# Patient Record
Sex: Male | Born: 1980 | Race: White | Hispanic: Yes | Smoking: Never smoker
Health system: Southern US, Community
[De-identification: ages and names within clinical notes are randomized; demographics above are authoritative.]

## PROBLEM LIST (undated history)

## (undated) HISTORY — PX: ABDOMINAL SURGERY: SHX537

---

## 2002-09-03 ENCOUNTER — Emergency Department (HOSPITAL_COMMUNITY): Admission: EM | Admit: 2002-09-03 | Discharge: 2002-09-03 | Payer: Self-pay | Admitting: Internal Medicine

## 2002-11-04 ENCOUNTER — Emergency Department (HOSPITAL_COMMUNITY): Admission: EM | Admit: 2002-11-04 | Discharge: 2002-11-04 | Payer: Self-pay | Admitting: Emergency Medicine

## 2005-10-08 ENCOUNTER — Emergency Department (HOSPITAL_COMMUNITY): Admission: EM | Admit: 2005-10-08 | Discharge: 2005-10-08 | Payer: Self-pay | Admitting: Emergency Medicine

## 2006-11-04 ENCOUNTER — Emergency Department (HOSPITAL_COMMUNITY): Admission: EM | Admit: 2006-11-04 | Discharge: 2006-11-04 | Payer: Self-pay | Admitting: Emergency Medicine

## 2008-09-24 ENCOUNTER — Emergency Department (HOSPITAL_COMMUNITY): Admission: EM | Admit: 2008-09-24 | Discharge: 2008-09-24 | Payer: Self-pay | Admitting: Emergency Medicine

## 2016-12-27 ENCOUNTER — Emergency Department (HOSPITAL_COMMUNITY)
Admission: EM | Admit: 2016-12-27 | Discharge: 2016-12-27 | Disposition: A | Discharge status: Home / Self Care | Payer: Self-pay | Attending: Emergency Medicine | Admitting: Emergency Medicine

## 2016-12-27 ENCOUNTER — Encounter (HOSPITAL_COMMUNITY): Payer: Self-pay | Admitting: Emergency Medicine

## 2016-12-27 ENCOUNTER — Emergency Department (HOSPITAL_COMMUNITY): Payer: Self-pay

## 2016-12-27 DIAGNOSIS — S0990XA Unspecified injury of head, initial encounter: Secondary | ICD-10-CM

## 2016-12-27 DIAGNOSIS — S80811A Abrasion, right lower leg, initial encounter: Secondary | ICD-10-CM | POA: Insufficient documentation

## 2016-12-27 DIAGNOSIS — Y92410 Unspecified street and highway as the place of occurrence of the external cause: Secondary | ICD-10-CM | POA: Insufficient documentation

## 2016-12-27 DIAGNOSIS — T07XXXA Unspecified multiple injuries, initial encounter: Secondary | ICD-10-CM

## 2016-12-27 DIAGNOSIS — S80812A Abrasion, left lower leg, initial encounter: Secondary | ICD-10-CM | POA: Insufficient documentation

## 2016-12-27 DIAGNOSIS — Y9389 Activity, other specified: Secondary | ICD-10-CM | POA: Insufficient documentation

## 2016-12-27 DIAGNOSIS — S0081XA Abrasion of other part of head, initial encounter: Secondary | ICD-10-CM | POA: Insufficient documentation

## 2016-12-27 DIAGNOSIS — Y999 Unspecified external cause status: Secondary | ICD-10-CM | POA: Insufficient documentation
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
## 2016-12-27 NOTE — ED Notes (Signed)
Pt transported to CT ?

## 2016-12-27 NOTE — ED Notes (Signed)
Pt reports forehead is bleeding some now and he is "seeing stars". Dr Juleen ChinaKohut made aware.
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
## 2019-11-07 ENCOUNTER — Ambulatory Visit: Payer: Self-pay | Attending: Internal Medicine

## 2019-11-07 ENCOUNTER — Other Ambulatory Visit: Payer: Self-pay

## 2019-11-07 DIAGNOSIS — U071 COVID-19: Secondary | ICD-10-CM | POA: Insufficient documentation

## 2019-11-07 DIAGNOSIS — Z20822 Contact with and (suspected) exposure to covid-19: Secondary | ICD-10-CM

## 2019-11-09 ENCOUNTER — Telehealth: Payer: Self-pay

## 2019-11-09 LAB — NOVEL CORONAVIRUS, NAA: SARS-CoV-2, NAA: DETECTED — AB

## 2019-11-09 NOTE — Telephone Encounter (Signed)
Wife and pt. Given COVID 19 results, verbalizes understanding. Will quarantine x 10 days from beginning of symptoms. Reviewed home safety and when to seek medical attention.Health Dept. Notified.

## 2024-10-11 ENCOUNTER — Emergency Department (HOSPITAL_COMMUNITY): Payer: Self-pay

## 2024-10-11 ENCOUNTER — Emergency Department (HOSPITAL_COMMUNITY)
Admission: EM | Admit: 2024-10-11 | Discharge: 2024-10-11 | Disposition: A | Discharge status: Home / Self Care | Payer: Self-pay | Attending: Emergency Medicine | Admitting: Emergency Medicine

## 2024-10-11 ENCOUNTER — Encounter (HOSPITAL_COMMUNITY): Payer: Self-pay

## 2024-10-11 ENCOUNTER — Other Ambulatory Visit: Payer: Self-pay

## 2024-10-11 DIAGNOSIS — W1842XA Slipping, tripping and stumbling without falling due to stepping into hole or opening, initial encounter: Secondary | ICD-10-CM | POA: Insufficient documentation

## 2024-10-11 DIAGNOSIS — S82891A Other fracture of right lower leg, initial encounter for closed fracture: Secondary | ICD-10-CM

## 2024-10-11 DIAGNOSIS — S8261XA Displaced fracture of lateral malleolus of right fibula, initial encounter for closed fracture: Secondary | ICD-10-CM | POA: Insufficient documentation

## 2024-10-11 MED ORDER — IBUPROFEN 400 MG PO TABS
600.0000 mg | ORAL_TABLET | Freq: Once | ORAL | Status: DC
  Filled 2024-10-11: qty 2
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
## 2024-10-11 NOTE — Discharge Instructions (Addendum)
 Seen today in the ER for a right ankle injury.  Your x-ray showed a small avulsion fracture of the tip of the lateral malleolus (ankle bone).  Make sure you wear the boot, use crutches as needed and follow-up with orthopedics.  Come back to the ER if you have new or worsening symptoms.  You can use ibuprofen  and Tylenol as needed for pain.  Come back to the ER for new or worsening symptoms.
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
